# Patient Record
Sex: Male | Born: 1995 | Race: Black or African American | Hispanic: No | Marital: Single | Smoking: Never smoker
Health system: Southern US, Community
[De-identification: ages and names within clinical notes are randomized; demographics above are authoritative.]

---
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
## 2015-06-24 ENCOUNTER — Emergency Department (HOSPITAL_COMMUNITY)
Admission: EM | Admit: 2015-06-24 | Discharge: 2015-06-24 | Disposition: A | Discharge status: Home / Self Care | Payer: Medicaid Other | Attending: Emergency Medicine | Admitting: Emergency Medicine

## 2015-06-24 ENCOUNTER — Encounter (HOSPITAL_COMMUNITY): Payer: Self-pay | Admitting: *Deleted

## 2015-06-24 DIAGNOSIS — T161XXA Foreign body in right ear, initial encounter: Secondary | ICD-10-CM | POA: Insufficient documentation

## 2015-06-24 DIAGNOSIS — Y9289 Other specified places as the place of occurrence of the external cause: Secondary | ICD-10-CM | POA: Diagnosis not present

## 2015-06-24 DIAGNOSIS — Y9389 Activity, other specified: Secondary | ICD-10-CM | POA: Diagnosis not present

## 2015-06-24 DIAGNOSIS — Y998 Other external cause status: Secondary | ICD-10-CM | POA: Insufficient documentation

## 2015-06-24 DIAGNOSIS — H9201 Otalgia, right ear: Secondary | ICD-10-CM | POA: Diagnosis present

## 2015-06-24 DIAGNOSIS — W208XXA Other cause of strike by thrown, projected or falling object, initial encounter: Secondary | ICD-10-CM | POA: Diagnosis not present

## 2015-06-24 DIAGNOSIS — Z79899 Other long term (current) drug therapy: Secondary | ICD-10-CM | POA: Diagnosis not present

## 2015-06-24 NOTE — ED Notes (Signed)
Pt reports th rubber portion of ear bud came off and fell in Rt ear .

## 2015-06-24 NOTE — ED Provider Notes (Signed)
CSN: [id number]     Arrival date & time 06/24/15  1102 History   By signing my name below, I, Iona Beard, attest that this documentation has been prepared under the direction and in the presence of Rayen Palen C. [place] Pelto, PA-C.  Electronically Signed: Iona Beard, ED Scribe 06/24/2015 at 12:19 PM.    Chief Complaint  Patient presents with  . Otalgia    The history is provided by the patient. No language interpreter was used.   HPI Comments: Christopher Mercer is a 20 y.o. male who presents to the Emergency Department complaining of sudden onset, constant right ear pain, onset a few hours ago after an ear bud became lodged in his ear. Pt claims that he has tried to remove the ear bud with his hand but was afraid of pushing it deeper into his ear. No worsening or alleviating factors were noted. Pt denies any other pertinent symptoms.   History reviewed. No pertinent past medical history. History reviewed. No pertinent past surgical history. Family History  Problem Relation Age of Onset  . Diabetes Father    Social History  Substance Use Topics  . Smoking status: Never Smoker   . Smokeless tobacco: None  . Alcohol Use: No     Review of Systems  Constitutional: Negative for activity change.  HENT: Positive for ear pain. Negative for ear discharge and hearing loss.   Neurological: Negative for headaches.     Allergies  Review of patient's allergies indicates no known allergies.  Home Medications   Prior to Admission medications   Medication Sig Start Date End Date Taking? Authorizing Provider  miconazole (MICOTIN) 2 % cream Apply topically 2 (two) times daily. 01/01/13   Cathlyn Parsons, NP    BP 129/65 mmHg  Pulse 67  Temp(Src) 97.9 F (36.6 C) (Oral)  Resp 16  Ht  (1.778 m)  Wt 102.513 kg  BMI 32.43 kg/m2  SpO2 98% Physical Exam  Constitutional: He is oriented to person, place, and time. He appears well-developed and well-nourished. No distress.  HENT:   Head: Normocephalic and atraumatic.  Right Ear: Hearing, tympanic membrane and external ear normal. No lacerations. No drainage, swelling or tenderness. A foreign body is present.  Left Ear: Hearing, tympanic membrane, external ear and ear canal normal. No lacerations. No drainage, swelling or tenderness.  Nose: Nose normal.  Mouth/Throat: Uvula is midline, oropharynx is clear and moist and mucous membranes are normal.  Rubber ear bud lodged in right ear.   Eyes: Conjunctivae and EOM are normal. Pupils are equal, round, and reactive to light. Right eye exhibits no discharge. Left eye exhibits no discharge. No scleral icterus.  Neck: Normal range of motion. Neck supple.  Cardiovascular: Normal rate and regular rhythm.   Pulmonary/Chest: Effort normal and breath sounds normal. No respiratory distress.  Musculoskeletal: Normal range of motion. He exhibits no edema or tenderness.  Neurological: He is alert and oriented to person, place, and time.  Skin: Skin is warm and dry. He is not diaphoretic.  Psychiatric: He has a normal mood and affect. His behavior is normal.  Nursing note and vitals reviewed.   ED Course  .Foreign Body Removal Date/Time: 06/24/2015 1:59 PM Performed by: Mady Gemma Authorized by: Glean Hess C Consent: Verbal consent obtained. Risks and benefits: risks, benefits and alternatives were discussed Consent given by: patient Patient understanding: patient states understanding of the procedure being performed Patient consent: the patient's understanding of the procedure matches consent given Procedure consent: procedure  consent matches procedure scheduled Relevant documents: relevant documents present and verified Site marked: the operative site was marked Required items: required blood products, implants, devices, and special equipment available Patient identity confirmed: verbally with patient Time out: Immediately prior to procedure a "time out" was  called to verify the correct patient, procedure, equipment, support staff and site/side marked as required. Body area: ear Patient sedated: no Patient restrained: no Patient cooperative: yes Localization method: visualized Removal mechanism: forceps Complexity: simple 1 objects recovered. Objects recovered: rubber ear bud Post-procedure assessment: foreign body removed Patient tolerance: Patient tolerated the procedure well with no immediate complications   DIAGNOSTIC STUDIES: Oxygen Saturation is 98% on RA, normal by my interpretation.    COORDINATION OF CARE: 12:12 PM-Discussed treatment plan which includes ear bud removal from right ear with pt at bedside and pt agreed to plan.   Labs Review Labs Reviewed - No data to display  Imaging Review No results found.    EKG Interpretation None      MDM   Final diagnoses:  FB ear, right, initial encounter    20 year old male presents with ear bud lodged in right ear, which he states occurred this morning prior to arrival. On exam, right rubber ear bud visualized in right ear canal. FB removed, which the patient tolerated well. On reassessment of patient s/p FB removal, no signs of trauma to ear. Canal and TM clear. Patient to follow-up with PCP as needed. Advised to avoid pushing ear bud deep into ear. Patient verbalizes his understanding and is in agreement with plan.  BP 129/65 mmHg  Pulse 67  Temp(Src) 97.9 F (36.6 C) (Oral)  Resp 16  Ht  (1.778 m)  Wt 102.513 kg  BMI 32.43 kg/m2  SpO2 98%  I personally performed the services described in this documentation, which was scribed in my presence. The recorded information has been reviewed and is accurate.     Mady Gemma, PA-C 06/24/15 1403  Raeford Razor, MD 06/25/15 [phone]

## 2015-06-24 NOTE — ED Notes (Signed)
Declined W/C at D/C and was escorted to lobby by RN. 

## 2015-06-24 NOTE — Discharge Instructions (Signed)
1. Medications: usual home medications 2. Treatment: rest, drink plenty of fluids 3. Follow Up: please followup with your primary doctor for discussion of your diagnoses and further evaluation after today's visit; if you do not have a primary care doctor use the resource guide provided to find one; please return to the ER for new or worsening symptoms   Ear Foreign Body An ear foreign body is an object that is stuck in your ear. Objects in your ear can cause:  Pain.  Buzzing or roaring sounds.  Hearing loss.  Fluid coming from your ear (drainage) or bleeding.  Feeling sick to your stomach (nausea) or throwing up (vomiting).  A feeling that your ear is full. HOME CARE  Keep all follow-up visits as told by your doctor. This is important.  Take medicines only as told by your doctor.  If you were prescribed an antibiotic medicine, finish it all even if you start to feel better. GET HELP IF:  You have a headache.  Your have blood coming from your ear.  You have a fever.  You have increased pain or swelling of your ear.  Your hearing is reduced.  You have discharge coming from your ear.   This information is not intended to replace advice given to you by your health care provider. Make sure you discuss any questions you have with your health care provider.   Document Released: 10/22/2009 Document Revised: 05/25/2014 Document Reviewed: 12/18/2013 Elsevier Interactive Patient Education 2016 [employer].   Emergency Department Resource Guide 1) Find a Doctor and Pay Out of Pocket Although you won't have to find out who is covered by your insurance plan, it is a good idea to ask around and get recommendations. You will then need to call the office and see if the doctor you have chosen will accept you as a new patient and what types of options they offer for patients who are self-pay. Some doctors offer discounts or will set up payment plans for their patients who do not have  insurance, but you will need to ask so you aren't surprised when you get to your appointment.  2) Contact Your Local Health Department Not all health departments have doctors that can see patients for sick visits, but many do, so it is worth a call to see if yours does. If you don't know where your local health department is, you can check in your phone book. The CDC also has a tool to help you locate your state's health department, and many state websites also have listings of all of their local health departments.  3) Find a Walk-in Clinic If your illness is not likely to be very severe or complicated, you may want to try a walk in clinic. These are popping up all over the country in pharmacies, drugstores, and shopping centers. They're usually staffed by nurse practitioners or physician assistants that have been trained to treat common illnesses and complaints. They're usually fairly quick and inexpensive. However, if you have serious medical issues or chronic medical problems, these are probably not your best option.  No Primary Care Doctor: - Call Health Connect at  [phone] - they can help you locate a primary care doctor that  accepts your insurance, provides certain services, etc. - Physician Referral Service- [phone]  Chronic Pain Problems: Organization         Address  Phone   Notes  [facility]  [phone] Patients need to be referred by their  primary care doctor.   Medication Assistance: Organization         Address  Phone   Notes  [facility] Medication [facility] [address] [place], [place] [address] [phone] --Must be a resident of [facility] -- Must have NO insurance coverage whatsoever (no Medicaid/ Medicare, etc.) -- The pt. MUST have a primary care doctor that directs their care regularly and follows them in the community   [facility]  [phone]   [employer]  [phone]    Agencies that provide  inexpensive medical care: Organization         Address  Phone   Notes  [facility]  [phone]   [facility]    [phone]   [facility] [address] [place], [place] [address] [phone]   [facility] [address] [place] [place] [phone]   Planned Parenthood    [phone]   [facility]    [phone]   [facility]  [address], [place] Phone:  [phone], Fax:  [phone] Hours of Operation:  9 am - 6 pm, M-F.  Also accepts Medicaid/Medicare and self-pay.  [facility]  [address], [place] Phone: [phone], Fax: [phone]. Hours of Operation:  8:30 am - 5:30 pm, M-F.  Also accepts Medicaid and self-pay.  [facility] [place] [address], [place] Phone: [phone]   [facility] [address] Natasha Bence [place], [place] [phone], Ext. [phone] Mondays & Thursdays: 7-9 AM.  First 15 patients are seen on a first come, first serve basis.    Medicaid-accepting [facility] Providers:  Organization         Address  Phone   Notes  [facility] [address], [place] [phone] Also accepts self-pay patients.  [facility] [address] Laurell Josephs [place], [place]  [phone]   [facility] [address], [place] [phone]   [facility] [address] [place] [phone]   Renaye Rakers [address], [place]   [phone] Only accepts [place] Access [place] patients after they have their name applied to their card.   Self-Pay (no insurance) in [facility]:  Organization         Address  Phone   Notes  Sickle Cell Patients, [facility] [address] [place], [place] [phone]   [facility]gent  Care [address] [place], [place] [phone]   [facility] [place]  [address], [place] [phone]   [facility]/Dr. Osei-Bonsu  [address], [place] or [address], [place] [phone] Phone number for both [place] and [place] locations is the same.  [facility] [address] [place] [phone]   [facility] [address], [place] or [address] [phone]   [facility] [address] [place] [phone], phone; [phone], fax Sees patients 1st and 3rd Saturday of every month.  Must not qualify for public or private insurance (i.e. Medicaid, Medicare, [place] Health Choice, Veterans' Benefits)  Household income should be no more than 200%  of the poverty level The clinic cannot treat you if you are pregnant or think you are pregnant  Sexually transmitted diseases are not treated at the clinic.    Dental Care: Organization         Address  Phone  Notes  [facility] Department of [facility] [address] [place], [place] [phone] Accepts children up to age 25 who are enrolled in [place] or [place] Health Choice; pregnant women with a Medicaid card; and children who have applied for Medicaid or [place] Health Choice, but were declined, whose parents can pay a reduced fee at time of service.  [facility] Department of [facility]  [address], [place] [phone] Accepts children up to age 40 who are enrolled in [place] or [place] Health Choice; pregnant women with a Medicaid card; and children who have applied for Medicaid or  Health Choice, but were declined, whose parents can pay a reduced fee at time of service.  [facility]  [address] [place], [place] [phone] Patients are seen by appointment only. Walk-ins are  not accepted. [facility] will see patients 81 years of age and older. Monday - Tuesday (8am-5pm) Most Wednesdays (8:30-5pm) $30 per visit, cash only  [facility]  [address], [facility] [phone] Patients are seen by appointment only. Walk-ins are not accepted. [facility] will see patients 85 years of age and older. One Wednesday Evening (Monthly: Volunteer Based).  $30 per visit, cash only  [employer]  [phone] for adults; Children under age 30, call Graduate Pediatric Dentistry at [phone]. Children aged 55-14, please call [phone] to request a pediatric application.  Dental services are provided in all areas of dental care including fillings, crowns and bridges, complete and partial dentures, implants, gum treatment, root canals, and extractions. Preventive care is also provided. Treatment is provided to both adults and children. Patients are selected via a lottery and there is often a waiting list.   [facility] [address], [place]  [phone] [email or web address]   [facility] [address] [place], [place] [phone], Ext. [phone] Second and Fourth Thursday of each month, opens at 6:30 AM; Clinic ends at 9 AM.  Patients are seen on a first-come first-served basis, and a limited number are seen during each clinic.   [facility]  [address] Ether Griffins [place], [place] [phone]   Eligibility Requirements You must have lived in [place], [place], or [place] counties for at least the last three months.   You cannot be eligible for state or federal sponsored [place], including [employer], [place], or [employer].   You generally cannot be eligible for healthcare insurance through your employer.    How to apply: Eligibility screenings are held every Tuesday and Wednesday afternoon from 1:00 pm until 4:00 pm. You do not need an appointment for  the interview!  [facility] [address] [place], [place] [phone]   [facility] Health Department  [phone]   [facility] Health Department  [phone]   [facility] Health Department  [phone]    Behavioral Health Resources in the Community: Intensive Outpatient Programs Organization         Address  Phone  Notes  [facility] [address], [place], [place] [phone]   [facility] Health Outpatient  [address], [place], [place] [phone]   [facility] [address] [place], [place]  [phone]   [facility] Mental Health [address],  [place], [place] [phone] or [phone]   Substance Abuse Resources Organization         Address  Phone  Notes  [facility]  [phone]   [facility]  [phone]   [facility]  [phone]   Floydene Flock  [phone]   Residential & Outpatient Substance Abuse Program  [phone]   Psychological Services Organization         Address  Phone  Notes  [facility] Health  [phone]   [facility] Services  [phone]   [facility] Mental Health [address] [place] [phone] or [phone]    Mobile Crisis Teams Organization         Address  Phone  Notes  [facility], Mobile Crisis Care Unit  [phone]   [facility]  [address] [place], [place] [phone]   Doristine Locks [address] [place] [phone]    Self-Help/Support Groups Organization         Address  Phone             Notes  [facility] [place] - variety of support groups  336- I7437963 Call for more information  Narcotics Anonymous (NA), [facility] [address], [employer] [place]  2 meetings at this location   Statistician         Address  Phone  Notes  [facility]  [address],    [address] [place]  [phone]   [facility]  [address] [place] [address], [place], [place] [phone]   [facility] [address] [place], [place] [phone] Admissions: 8am-3pm M-F  [facility] [address],    [place], [place] [phone]   [facility] [address] [place], [place], [place] [phone]   [facility] [address]  [place], [place] [phone]   [facility] - Intensive Outpatient [address] Laurell Josephs [address], [place], [place] [phone]   [facility] ([facility] [address]  [place], [place] [phone] or [phone]   [facility] ([facility]) [address] [place], [place] [phone] Accepts Medicaid  [facility] [address],  [place] [phone] Substance Abuse/Addiction Treatment   [facility] Organization         Address  Phone  Notes  [facility]  [phone]   Angie Fava, PhD [address] Ervin Knack [place], [place]   [phone] or [phone]   [facility] Behavioral   [address] [place], [place] [phone]   [facility] [address], [place], [place] [phone] Insurance/Medicaid/sponsorship through [facility] and Families [address]                                    [place], [place] [phone] Therapy/tele-psych/case  [facility] [address] [place], [place] [phone]    Dr. Lolly Mustache  [phone]   [facility]  [employer] [facility] Dept. 1) [address] [place] 2) [address], [place] 3)  [address],  [place] [phone]  [phone]   [place] [phone] or [phone] (After Hours)

## 2016-03-23 IMAGING — DX DG KNEE AP/LAT W/ SUNRISE*R*
4 series · 4 of 4 positions shown · non-contrast
Comparison: None.

CLINICAL DATA: Trauma to the right knee 2 days ago playing
basketball

EXAM:
RIGHT KNEE 3 VIEWS

[knee ap]
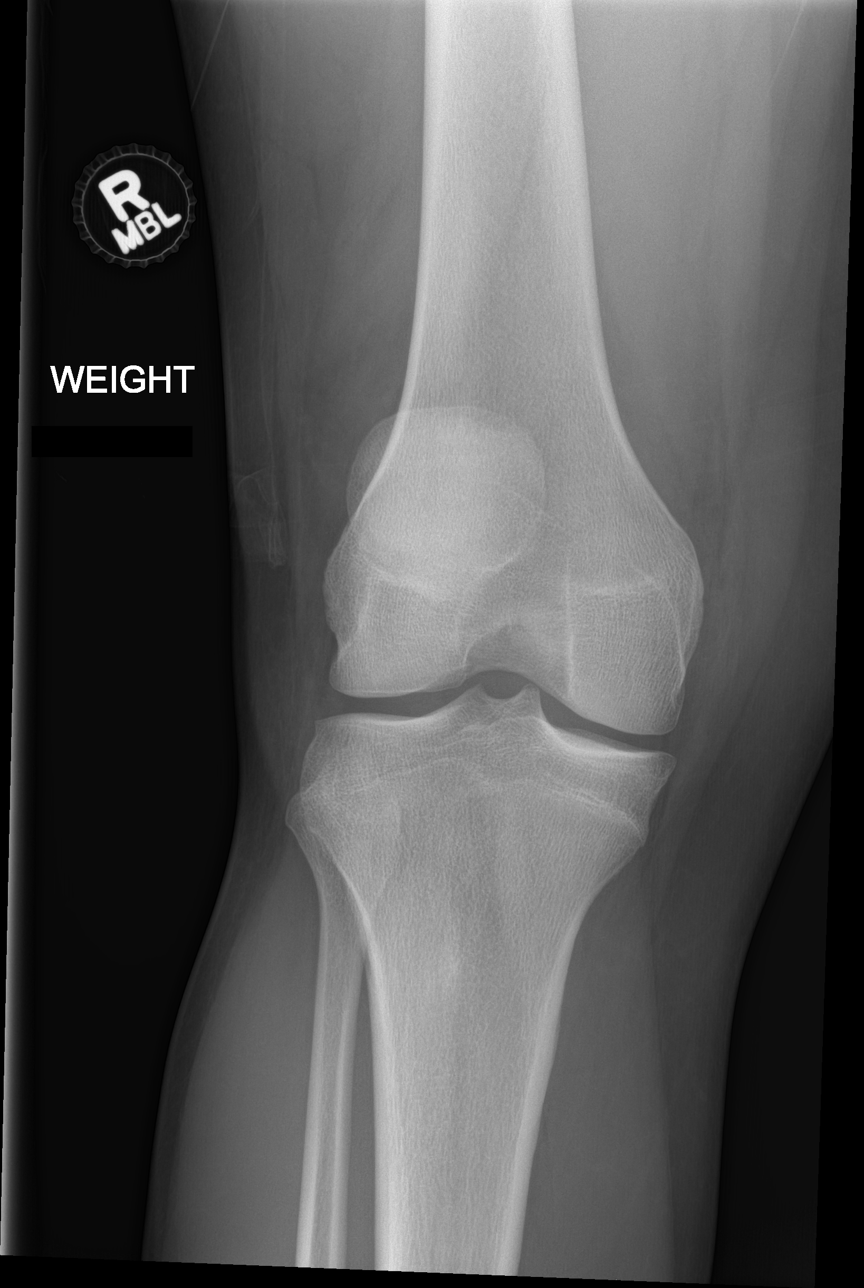

[knee lat (1 of 2)]
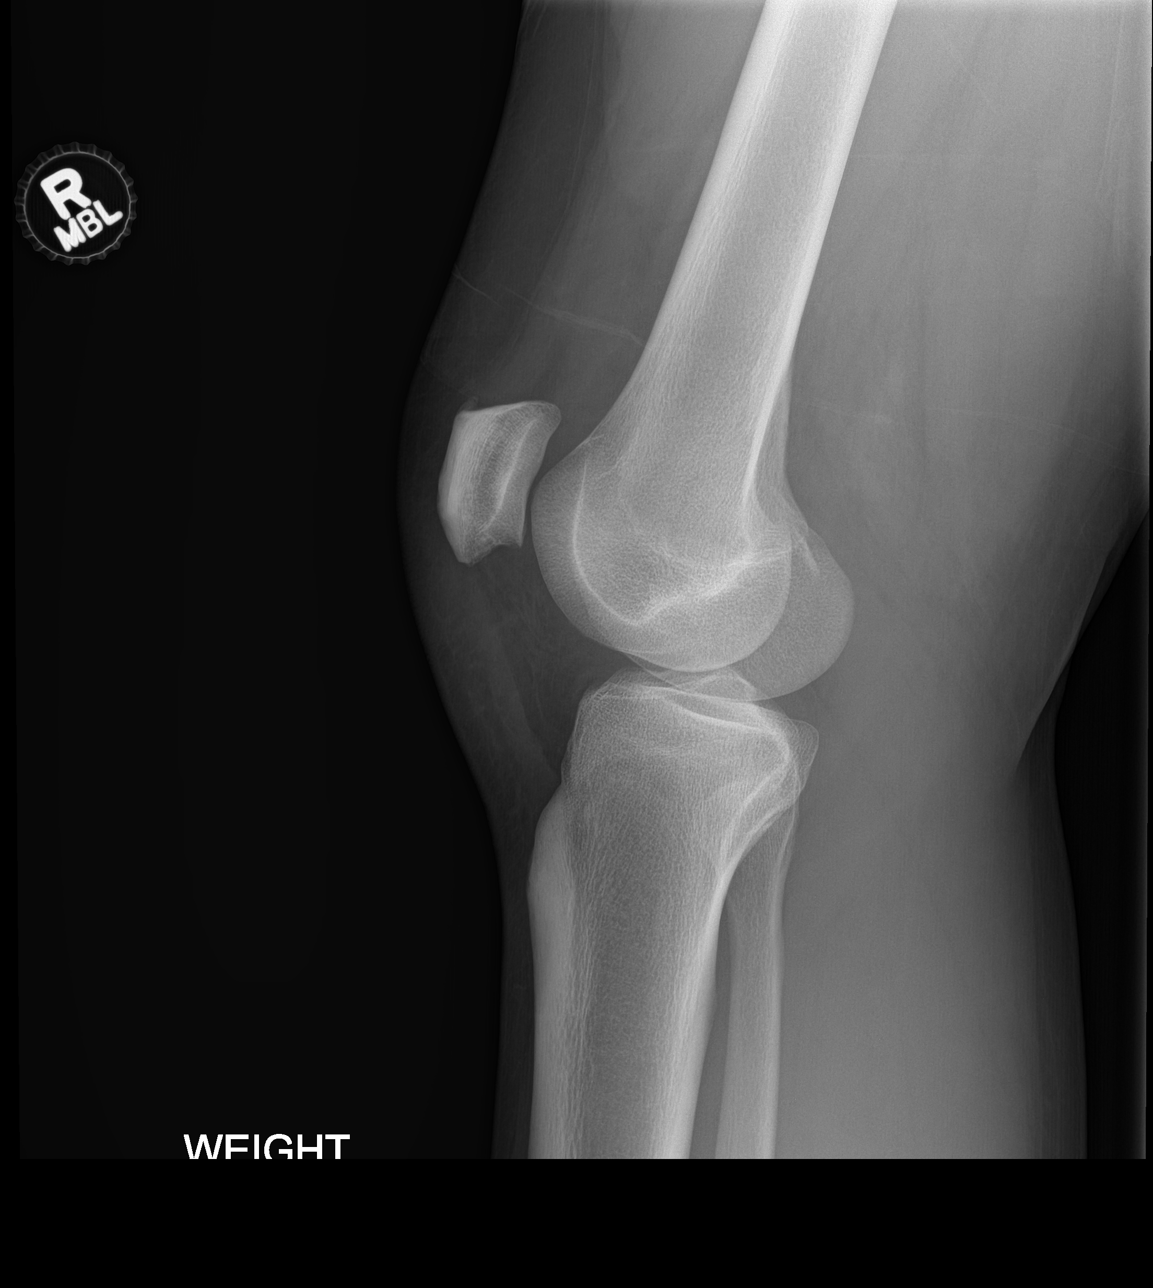

[patella skyline]
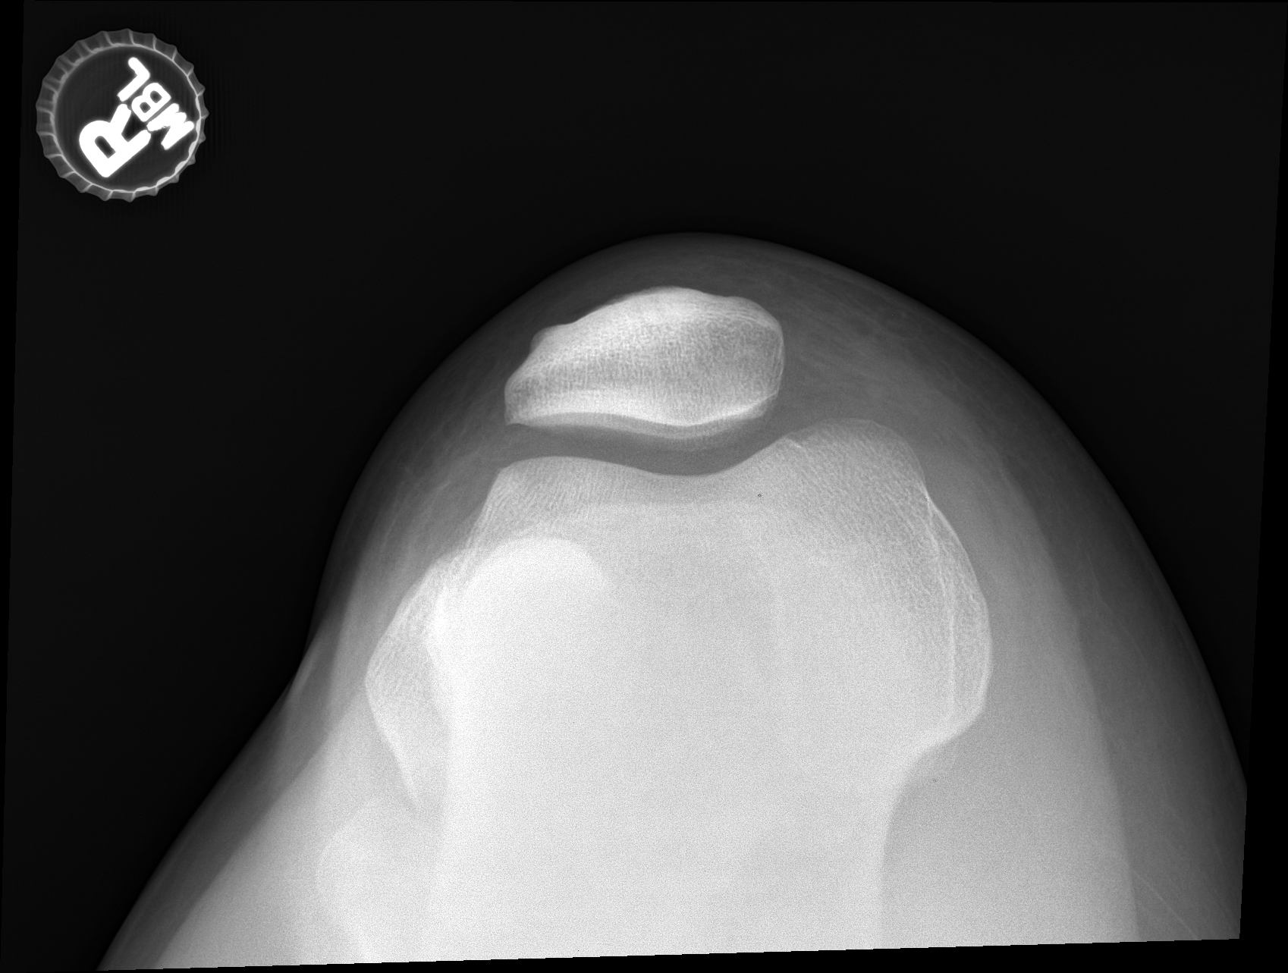

[knee lat (2 of 2)]
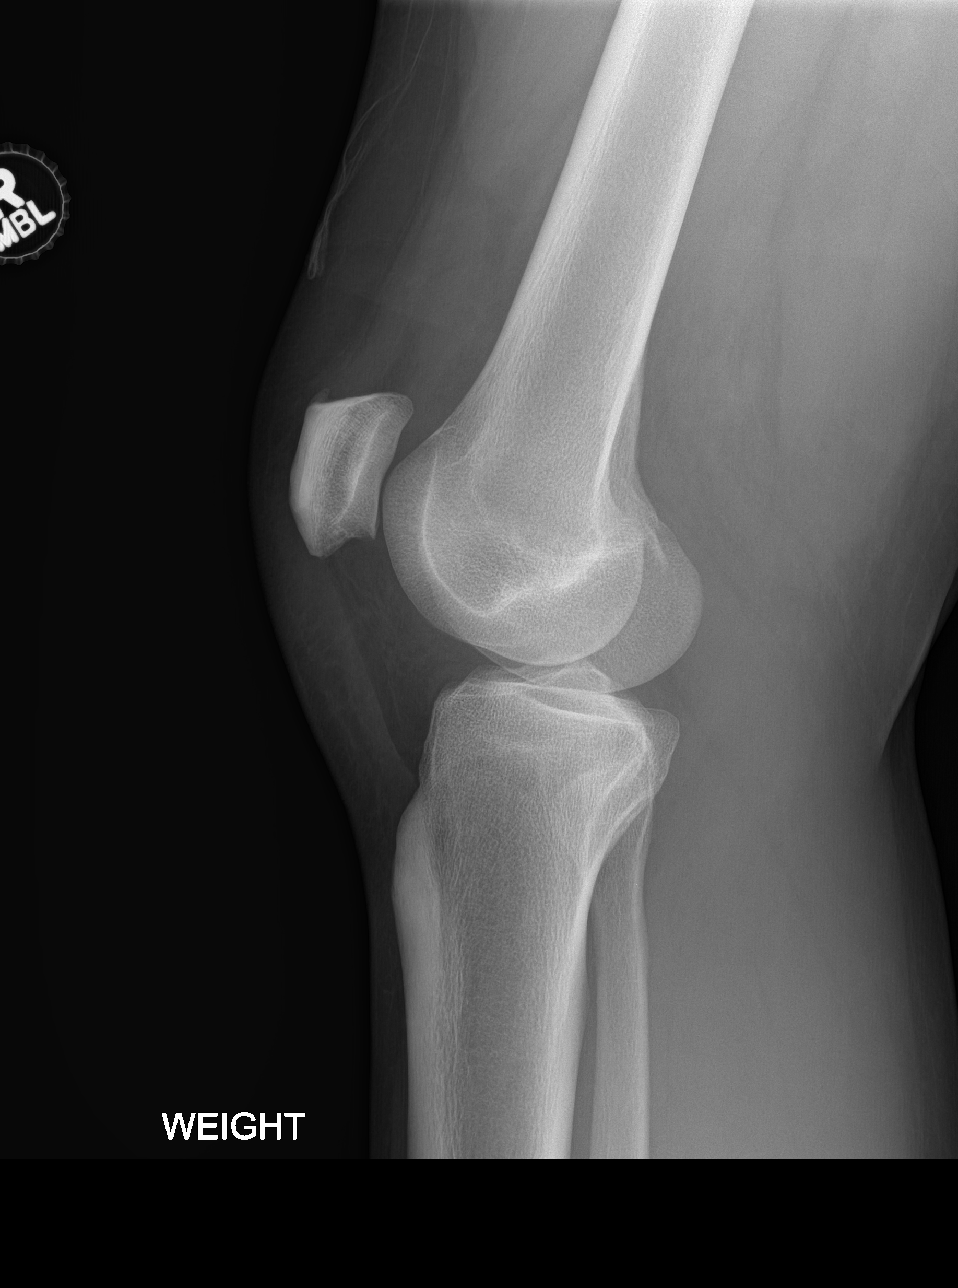

[4 of 4 positions shown; findings below may reference images not displayed]

FINDINGS: There is no evidence of fracture, dislocation, or joint effusion.
There is no evidence of arthropathy or other focal bone abnormality.
Soft tissues are unremarkable.
IMPRESSION: Negative.
